# Patient Record
Sex: Female | Born: 1980 | Race: White | Hispanic: No | Marital: Married | Smoking: Former smoker
Health system: Southern US, Community
[De-identification: ages and names within clinical notes are randomized; demographics above are authoritative.]

## PROBLEM LIST (undated history)

## (undated) DIAGNOSIS — N809 Endometriosis, unspecified: Secondary | ICD-10-CM

## (undated) DIAGNOSIS — I8290 Acute embolism and thrombosis of unspecified vein: Secondary | ICD-10-CM

## (undated) DIAGNOSIS — R87619 Unspecified abnormal cytological findings in specimens from cervix uteri: Secondary | ICD-10-CM

## (undated) DIAGNOSIS — N83209 Unspecified ovarian cyst, unspecified side: Secondary | ICD-10-CM

## (undated) DIAGNOSIS — N979 Female infertility, unspecified: Secondary | ICD-10-CM

## (undated) HISTORY — DX: Unspecified ovarian cyst, unspecified side: N83.209

## (undated) HISTORY — DX: Female infertility, unspecified: N97.9

## (undated) HISTORY — PX: ABLATION: SHX5711

## (undated) HISTORY — DX: Endometriosis, unspecified: N80.9

## (undated) HISTORY — DX: Acute embolism and thrombosis of unspecified vein: I82.90

## (undated) HISTORY — DX: Unspecified abnormal cytological findings in specimens from cervix uteri: R87.619

## (undated) HISTORY — PX: COLPOSCOPY: SHX161

---

## 2018-11-07 DIAGNOSIS — I83811 Varicose veins of right lower extremities with pain: Secondary | ICD-10-CM | POA: Diagnosis not present

## 2018-11-07 DIAGNOSIS — I8311 Varicose veins of right lower extremity with inflammation: Secondary | ICD-10-CM | POA: Diagnosis not present

## 2018-12-19 DIAGNOSIS — I83811 Varicose veins of right lower extremities with pain: Secondary | ICD-10-CM | POA: Diagnosis not present

## 2018-12-19 DIAGNOSIS — I8311 Varicose veins of right lower extremity with inflammation: Secondary | ICD-10-CM | POA: Diagnosis not present

## 2019-05-14 ENCOUNTER — Encounter: Payer: Self-pay | Admitting: Certified Nurse Midwife

## 2019-07-04 ENCOUNTER — Ambulatory Visit (INDEPENDENT_AMBULATORY_CARE_PROVIDER_SITE_OTHER): Payer: BC Managed Care – PPO | Admitting: Certified Nurse Midwife

## 2019-07-04 ENCOUNTER — Other Ambulatory Visit: Payer: Self-pay

## 2019-07-04 ENCOUNTER — Other Ambulatory Visit (HOSPITAL_COMMUNITY)
Admission: RE | Admit: 2019-07-04 | Discharge: 2019-07-04 | Disposition: A | Discharge status: Home / Self Care | Payer: BC Managed Care – PPO | Source: Ambulatory Visit | Attending: Certified Nurse Midwife | Admitting: Certified Nurse Midwife

## 2019-07-04 ENCOUNTER — Encounter: Payer: Self-pay | Admitting: Certified Nurse Midwife

## 2019-07-04 VITALS — BP 110/74 | HR 70 | Temp 98.1°F | Ht 65.75 in | Wt 156.0 lb

## 2019-07-04 DIAGNOSIS — Z124 Encounter for screening for malignant neoplasm of cervix: Secondary | ICD-10-CM

## 2019-07-04 DIAGNOSIS — Z01419 Encounter for gynecological examination (general) (routine) without abnormal findings: Secondary | ICD-10-CM

## 2019-07-04 DIAGNOSIS — Z30011 Encounter for initial prescription of contraceptive pills: Secondary | ICD-10-CM

## 2019-07-04 MED ORDER — NORETHINDRONE 0.35 MG PO TABS: 1.0000 | ORAL_TABLET | Freq: Every day | ORAL | 4 refills | Status: DC

## 2019-07-04 NOTE — Progress Notes (Signed)
39 y.o. G0P0000 Married  Caucasian Fe here to establish gyn care and  for annual exam. Periods are regular 3 day duration and scant to none. Contraception OCP. Labs with last MD. Denies warning signs with use. History of blood clot in right leg was seen by MD and has resolved. Denies Migraine headaches. Has appointment for Covid vaccine soon. Recent move here from [place] due spouse employment. No other medical concerns today.  Patient's last menstrual period was 05/16/2019 (exact date).          Sexually active: Yes.    The current method of family planning is oral progesterone-only contraceptive.    Exercising: Yes.    walking & yoga Smoker:  no  Review of Systems  Constitutional: Negative.   HENT: Negative.   Eyes: Negative.   Respiratory: Negative.   Cardiovascular: Negative.   Gastrointestinal: Negative.   Genitourinary: Negative.   Musculoskeletal: Negative.   Skin: Negative.   Neurological: Negative.   Endo/Heme/Allergies: Negative.   Psychiatric/Behavioral: Negative.     Health Maintenance: Pap:  2020? History of Abnormal Pap: yes MMG:  none Self Breast exams: yes Colonoscopy:  none BMD:   none TDaP:  2016 Shingles: no Pneumonia: no Hep C and HIV: not done Labs: if needed.   reports that she has quit smoking. She has never used smokeless tobacco. She reports previous alcohol use. She reports that she does not use drugs.  Past Medical History:  Diagnosis Date  . Abnormal Pap smear of cervix   . Blood clot in vein    leg  . Endometriosis   . Infertility, female   . Ovarian cyst     Past Surgical History:  Procedure Laterality Date  . ABLATION    . COLPOSCOPY      Current Outpatient Medications  Medication Sig Dispense Refill  . Levonorgestrel-Ethinyl Estradiol (AMETHIA) 0.15-0.03 &0.01 MG tablet TAKE 1 TABLET(S) EVERY DAY BY ORAL ROUTE.     No current facility-administered medications for this visit.    Family History  Problem Relation Age of Onset   . Cancer Maternal Grandfather     ROS:  Pertinent items are noted in HPI.  Otherwise, a comprehensive ROS was negative.  Exam:   Temp 98.1 F (36.7 C) (Skin)   Ht 5' 5.75" (1.67 m)   Wt 156 lb (70.8 kg)   LMP 05/16/2019 (Exact Date)   BMI 25.37 kg/m  Height: 5' 5.75" (167 cm) Ht Readings from Last 3 Encounters:  07/04/19 5' 5.75" (1.67 m)    General appearance: alert, cooperative and appears stated age Head: Normocephalic, without obvious abnormality, atraumatic Neck: no adenopathy, supple, symmetrical, trachea midline and thyroid normal to inspection and palpation Lungs: clear to auscultation bilaterally Breasts: normal appearance, no masses or tenderness, No nipple retraction or dimpling, No nipple discharge or bleeding, No axillary or supraclavicular adenopathy, Taught monthly breast self examination Heart: regular rate and rhythm Abdomen: soft, non-tender; no masses,  no organomegaly Extremities: extremities normal, atraumatic, no cyanosis or edema Skin: Skin color, texture, turgor normal. No rashes or lesions Lymph nodes: Cervical, supraclavicular, and axillary nodes normal. No abnormal inguinal nodes palpated Neurologic: Grossly normal   Pelvic: External genitalia:  no lesions, normal female              Urethra:  normal appearing urethra with no masses, tenderness or lesions              Bartholin's and Skene's: normal  Vagina: normal appearing vagina with normal color and discharge, no lesions              Cervix: no cervical motion tenderness, no lesions and nulliparous appearance              Pap taken: Yes.   Bimanual Exam:  Uterus:  normal size, contour, position, consistency, mobility, non-tender and anteverted              Adnexa: normal adnexa and no mass, fullness, tenderness               Rectovaginal: Confirms               Anus:  normal sphincter tone, no lesions  Chaperone present: yes  A:  Well Woman with normal exam  Contraception  OCP desired for cycle control, history of infertility  History of blood clot in right leg, no sequelae   History of ablation due to menorrhagia  History of abnormal pap smear no treatment  P:   Reviewed health and wellness pertinent to exam  Discussed estrogen use after blood clot in leg and contraindicated. Discussed progesterone only pill use recommended. Patient voiced understanding and would like to continue OCP for cycle control.  Rx Micronor see order with instructions  Warning signs with use reviewed and need to advise if occurs.  Pap smear: yes  counseled on breast self exam, mammography screening and information to schedule 6 weeks after last Covid vaccine, feminine hygiene, use and side effects of OCP's, adequate intake of calcium and vitamin D, diet and exercise  return annually or prn  An After Visit Summary was printed and given to the patient.

## 2019-07-04 NOTE — Patient Instructions (Signed)

## 2019-07-09 LAB — CYTOLOGY - PAP
Comment: NEGATIVE
Diagnosis: NEGATIVE
Diagnosis: REACTIVE
High risk HPV: NEGATIVE

## 2020-05-19 DIAGNOSIS — Z03818 Encounter for observation for suspected exposure to other biological agents ruled out: Secondary | ICD-10-CM | POA: Diagnosis not present

## 2020-05-19 DIAGNOSIS — Z20822 Contact with and (suspected) exposure to covid-19: Secondary | ICD-10-CM | POA: Diagnosis not present

## 2020-07-09 ENCOUNTER — Encounter: Payer: Self-pay | Admitting: Obstetrics & Gynecology

## 2020-07-09 ENCOUNTER — Ambulatory Visit (INDEPENDENT_AMBULATORY_CARE_PROVIDER_SITE_OTHER): Payer: BC Managed Care – PPO | Admitting: Obstetrics & Gynecology

## 2020-07-09 ENCOUNTER — Other Ambulatory Visit: Payer: Self-pay

## 2020-07-09 VITALS — BP 116/70 | Ht 66.0 in | Wt 139.0 lb

## 2020-07-09 DIAGNOSIS — Z3041 Encounter for surveillance of contraceptive pills: Secondary | ICD-10-CM | POA: Diagnosis not present

## 2020-07-09 DIAGNOSIS — Z01419 Encounter for gynecological examination (general) (routine) without abnormal findings: Secondary | ICD-10-CM | POA: Diagnosis not present

## 2020-07-09 NOTE — Progress Notes (Signed)
    Marlen Koman 05-18-1980 [id number]   History:    40 y.o. G0 Stable partner  RP:  Established patient presenting for annual gyn exam   HPI:  Periods are regular 3 day duration and scant to none on the Progestin-only pill.  No BTB.  No pelvic pain.  No pain with IC. H/O Endometriosis Dxed by LPS.  Urine/BMs normal.  Breasts normal.  No screening mammo yet.  History of superficial venous thrombosis was seen by Vein Specialist 2 yrs ago.  Walking/yoga and healthy nutrition.  BMI 22.44.   Past medical history,surgical history, family history and social history were all reviewed and documented in the EPIC chart.  Gynecologic History No LMP recorded. (Menstrual status: Oral contraceptives).  Obstetric History OB History  Gravida Para Term Preterm AB Living  0 0 0 0 0 0  SAB IAB Ectopic Multiple Live Births  0 0 0 0 0  Obstetric Comments  1 adopted child     ROS: A ROS was performed and pertinent positives and negatives are included in the history.  GENERAL: No fevers or chills. HEENT: No change in vision, no earache, sore throat or sinus congestion. NECK: No pain or stiffness. CARDIOVASCULAR: No chest pain or pressure. No palpitations. PULMONARY: No shortness of breath, cough or wheeze. GASTROINTESTINAL: No abdominal pain, nausea, vomiting or diarrhea, melena or bright red blood per rectum. GENITOURINARY: No urinary frequency, urgency, hesitancy or dysuria. MUSCULOSKELETAL: No joint or muscle pain, no back pain, no recent trauma. DERMATOLOGIC: No rash, no itching, no lesions. ENDOCRINE: No polyuria, polydipsia, no heat or cold intolerance. No recent change in weight. HEMATOLOGICAL: No anemia or easy bruising or bleeding. NEUROLOGIC: No headache, seizures, numbness, tingling or weakness. PSYCHIATRIC: No depression, no loss of interest in normal activity or change in sleep pattern.     Exam:   BP 116/70   Ht 5\' 6"  (1.676 m)   Wt 139 lb (63 kg)   BMI 22.44 kg/m   Body mass  index is 22.44 kg/m.  General appearance : Well developed well nourished female. No acute distress HEENT: Eyes: no retinal hemorrhage or exudates,  Neck supple, trachea midline, no carotid bruits, no thyroidmegaly Lungs: Clear to auscultation, no rhonchi or wheezes, or rib retractions  Heart: Regular rate and rhythm, no murmurs or gallops Breast:Examined in sitting and supine position were symmetrical in appearance, no palpable masses or tenderness,  no skin retraction, no nipple inversion, no nipple discharge, no skin discoloration, no axillary or supraclavicular lymphadenopathy Abdomen: no palpable masses or tenderness, no rebound or guarding Extremities: no edema or skin discoloration or tenderness  Pelvic: Vulva: Normal             Vagina: No gross lesions or discharge  Cervix: No gross lesions or discharge.  Pap reflex done.  Uterus  AV, normal size, shape and consistency, non-tender and mobile  Adnexa  Without masses or tenderness  Anus: Normal   Assessment/Plan:  40 y.o. female for annual exam   1. Encounter for routine gynecological examination with Papanicolaou smear of cervix Normal gynecologic exam.  Pap reflex done.  Breast exam normal.  Schedule screening mammogram now.  Good body mass index at 22.44.  Continue with fitness and healthy nutrition.  2. Encounter for surveillance of contraceptive pills Well on the progestin only pill.  No contraindication to continue.  Represcription sent to pharmacy.  46 MD, 9:27 AM 07/09/2020

## 2020-07-13 LAB — PAP IG W/ RFLX HPV ASCU

## 2020-07-14 ENCOUNTER — Other Ambulatory Visit: Payer: Self-pay

## 2020-07-14 DIAGNOSIS — Z30011 Encounter for initial prescription of contraceptive pills: Secondary | ICD-10-CM

## 2020-07-14 MED ORDER — NORETHINDRONE 0.35 MG PO TABS: 1.0000 | ORAL_TABLET | Freq: Every day | ORAL | 4 refills | Status: DC

## 2020-07-14 NOTE — Telephone Encounter (Signed)
AEX was 07/09/20.

## 2020-07-15 ENCOUNTER — Encounter: Payer: Self-pay | Admitting: Obstetrics & Gynecology

## 2020-07-16 ENCOUNTER — Other Ambulatory Visit: Payer: Self-pay | Admitting: Obstetrics & Gynecology

## 2020-07-16 DIAGNOSIS — Z1231 Encounter for screening mammogram for malignant neoplasm of breast: Secondary | ICD-10-CM

## 2020-09-07 ENCOUNTER — Ambulatory Visit
Admission: RE | Admit: 2020-09-07 | Discharge: 2020-09-07 | Disposition: A | Discharge status: Home / Self Care | Payer: BC Managed Care – PPO | Source: Ambulatory Visit | Attending: Obstetrics & Gynecology | Admitting: Obstetrics & Gynecology

## 2020-09-07 ENCOUNTER — Other Ambulatory Visit: Payer: Self-pay

## 2020-09-07 DIAGNOSIS — Z1231 Encounter for screening mammogram for malignant neoplasm of breast: Secondary | ICD-10-CM | POA: Diagnosis not present

## 2021-07-14 ENCOUNTER — Encounter: Payer: Self-pay | Admitting: Obstetrics & Gynecology

## 2021-07-14 ENCOUNTER — Ambulatory Visit (INDEPENDENT_AMBULATORY_CARE_PROVIDER_SITE_OTHER): Payer: BC Managed Care – PPO | Admitting: Obstetrics & Gynecology

## 2021-07-14 VITALS — BP 122/80 | Resp 14 | Ht 66.0 in | Wt 140.0 lb

## 2021-07-14 DIAGNOSIS — Z3041 Encounter for surveillance of contraceptive pills: Secondary | ICD-10-CM | POA: Diagnosis not present

## 2021-07-14 DIAGNOSIS — E559 Vitamin D deficiency, unspecified: Secondary | ICD-10-CM | POA: Diagnosis not present

## 2021-07-14 DIAGNOSIS — Z01419 Encounter for gynecological examination (general) (routine) without abnormal findings: Secondary | ICD-10-CM | POA: Diagnosis not present

## 2021-07-14 MED ORDER — NORETHINDRONE 0.35 MG PO TABS: 1.0000 | ORAL_TABLET | Freq: Every day | ORAL | 4 refills | Status: DC

## 2021-07-14 NOTE — Progress Notes (Signed)
? ? ?Alexandra Ballard Jun 09, 1980 [id number] ? ? ?History:    41 y.o.  G0 Stable partner ?  ?RP:  Established patient presenting for annual gyn exam  ?  ?HPI:  Periods are regular 3 day duration and scant to none on the Progestin-only pill.  No BTB.  No pelvic pain.  No pain with IC. Pap Neg 06/2020.  No H/O Abnormal Pap since her 20's when a Colpo was done, but no need for treatment.  H/O Endometriosis Dxed by LPS.  Urine/BMs normal.  Breasts normal.  Mammo Neg 08/2020.  History of superficial venous thrombosis was seen by Vein Specialist 3 yrs ago.  Walking/yoga and healthy nutrition.  BMI 22.6.  Fasting health labs here today. ? ? ?Past medical history,surgical history, family history and social history were all reviewed and documented in the EPIC chart. ? ?Gynecologic History ?No LMP recorded (lmp unknown). (Menstrual status: Oral contraceptives). ? ?Obstetric History ?OB History  ?Gravida Para Term Preterm AB Living  ?0 0 0 0 0 0  ?SAB IAB Ectopic Multiple Live Births  ?0 0 0 0 0  ?Obstetric Comments  ?1 adopted child  ? ? ? ?ROS: A ROS was performed and pertinent positives and negatives are included in the history. ? GENERAL: No fevers or chills. HEENT: No change in vision, no earache, sore throat or sinus congestion. NECK: No pain or stiffness. CARDIOVASCULAR: No chest pain or pressure. No palpitations. PULMONARY: No shortness of breath, cough or wheeze. GASTROINTESTINAL: No abdominal pain, nausea, vomiting or diarrhea, melena or bright red blood per rectum. GENITOURINARY: No urinary frequency, urgency, hesitancy or dysuria. MUSCULOSKELETAL: No joint or muscle pain, no back pain, no recent trauma. DERMATOLOGIC: No rash, no itching, no lesions. ENDOCRINE: No polyuria, polydipsia, no heat or cold intolerance. No recent change in weight. HEMATOLOGICAL: No anemia or easy bruising or bleeding. NEUROLOGIC: No headache, seizures, numbness, tingling or weakness. PSYCHIATRIC: No depression, no loss of interest in normal  activity or change in sleep pattern.  ?  ? ?Exam: ? ? ?BP 122/80 (BP Location: Right Arm, Patient Position: Sitting, Cuff Size: Normal)   Resp 14   Ht _0  (1.676 m)   Wt 140 lb (63.5 kg)   LMP  (LMP Unknown) Comment: spotting occasionally  BMI 22.60 kg/m?  ? ?Body mass index is 22.6 kg/m?. ? ?General appearance : Well developed well nourished female. No acute distress ?HEENT: Eyes: no retinal hemorrhage or exudates,  Neck supple, trachea midline, no carotid bruits, no thyroidmegaly ?Lungs: Clear to auscultation, no rhonchi or wheezes, or rib retractions  ?Heart: Regular rate and rhythm, no murmurs or gallops ?Breast:Examined in sitting and supine position were symmetrical in appearance, no palpable masses or tenderness,  no skin retraction, no nipple inversion, no nipple discharge, no skin discoloration, no axillary or supraclavicular lymphadenopathy ?Abdomen: no palpable masses or tenderness, no rebound or guarding ?Extremities: no edema or skin discoloration or tenderness ? ?Pelvic: Vulva: Normal ?            Vagina: No gross lesions or discharge ? Cervix: No gross lesions or discharge ? Uterus  AV, normal size, shape and consistency, non-tender and mobile ? Adnexa  Without masses or tenderness ? Anus: Normal ? ? ?Assessment/Plan:  41 y.o. female for annual exam  ? ?1. Encounter for routine gynecological examination with Papanicolaou smear of cervix ?Periods are regular 3 day duration and scant to none on the Progestin-only pill.  No BTB.  No pelvic pain.  No pain with  IC. Pap Neg 06/2020.  No H/O Abnormal Pap since her 20's when a Colpo was done, but no need for treatment.  H/O Endometriosis Dxed by LPS.  Urine/BMs normal.  Breasts normal.  Mammo Neg 08/2020.  History of superficial venous thrombosis was seen by Vein Specialist 3 yrs ago.  Walking/yoga and healthy nutrition.  BMI 22.6.  Fasting health labs here today. ?- CBC ?- Comp Met (CMET) ?- TSH ?- Lipid Profile ?- Vitamin D (25 hydroxy) ? ?2. Encounter  for surveillance of contraceptive pills ?Periods are regular 3 day duration and scant to none on the Progestin-only pill.  No BTB.  No pelvic pain.  No pain with IC.  No CI to continue on the Progestin pill, prescription sent to pharmacy. ?- norethindrone (MICRONOR) 0.35 MG tablet; Take 1 tablet (0.35 mg total) by mouth daily.  ? ?Princess Bruins MD, 8:39 AM 07/14/2021 ? ?  ?

## 2021-07-15 LAB — COMPREHENSIVE METABOLIC PANEL
AG Ratio: 1.9 (calc) (ref 1.0–2.5)
ALT: 13 U/L (ref 6–29)
AST: 15 U/L (ref 10–30)
Albumin: 4.8 g/dL (ref 3.6–5.1)
Alkaline phosphatase (APISO): 55 U/L (ref 31–125)
BUN: 14 mg/dL (ref 7–25)
CO2: 27 mmol/L (ref 20–32)
Calcium: 9.5 mg/dL (ref 8.6–10.2)
Chloride: 105 mmol/L (ref 98–110)
Creat: 0.7 mg/dL (ref 0.50–0.99)
Globulin: 2.5 g/dL (calc) (ref 1.9–3.7)
Glucose, Bld: 90 mg/dL (ref 65–99)
Potassium: 4.8 mmol/L (ref 3.5–5.3)
Sodium: 139 mmol/L (ref 135–146)
Total Bilirubin: 0.6 mg/dL (ref 0.2–1.2)
Total Protein: 7.3 g/dL (ref 6.1–8.1)

## 2021-07-15 LAB — LIPID PANEL
Cholesterol: 170 mg/dL (ref <200)
HDL: 60 mg/dL (ref >=50)
LDL Cholesterol (Calc): 99 mg/dL (calc)
Non-HDL Cholesterol (Calc): 110 mg/dL (calc) (ref <130)
Total CHOL/HDL Ratio: 2.8 (calc) (ref <5.0)
Triglycerides: 39 mg/dL (ref <150)

## 2021-07-15 LAB — CBC
HCT: 41.7 % (ref 35.0–45.0)
Hemoglobin: 14.1 g/dL (ref 11.7–15.5)
MCH: 31.8 pg (ref 27.0–33.0)
MCHC: 33.8 g/dL (ref 32.0–36.0)
MCV: 93.9 fL (ref 80.0–100.0)
MPV: 10.9 fL (ref 7.5–12.5)
Platelets: 231 10*3/uL (ref 140–400)
RBC: 4.44 10*6/uL (ref 3.80–5.10)
RDW: 12 % (ref 11.0–15.0)
WBC: 5.6 10*3/uL (ref 3.8–10.8)

## 2021-07-15 LAB — VITAMIN D 25 HYDROXY (VIT D DEFICIENCY, FRACTURES): Vit D, 25-Hydroxy: 18 ng/mL — ABNORMAL LOW (ref 30–100)

## 2021-07-15 LAB — TSH: TSH: 2.11 mIU/L

## 2021-07-25 ENCOUNTER — Other Ambulatory Visit: Payer: Self-pay | Admitting: *Deleted

## 2021-07-25 DIAGNOSIS — E559 Vitamin D deficiency, unspecified: Secondary | ICD-10-CM

## 2021-07-25 MED ORDER — VITAMIN D (ERGOCALCIFEROL) 1.25 MG (50000 UNIT) PO CAPS: 50000.0000 [IU] | ORAL_CAPSULE | ORAL | 0 refills | Status: DC

## 2021-08-02 ENCOUNTER — Encounter: Payer: Self-pay | Admitting: Obstetrics & Gynecology

## 2021-08-26 ENCOUNTER — Other Ambulatory Visit: Payer: Self-pay | Admitting: Obstetrics & Gynecology

## 2021-08-26 DIAGNOSIS — Z1231 Encounter for screening mammogram for malignant neoplasm of breast: Secondary | ICD-10-CM

## 2021-09-08 ENCOUNTER — Ambulatory Visit
Admission: RE | Admit: 2021-09-08 | Discharge: 2021-09-08 | Disposition: A | Discharge status: Home / Self Care | Payer: Self-pay | Source: Ambulatory Visit | Attending: Obstetrics & Gynecology | Admitting: Obstetrics & Gynecology

## 2021-09-08 DIAGNOSIS — Z1231 Encounter for screening mammogram for malignant neoplasm of breast: Secondary | ICD-10-CM

## 2021-10-18 ENCOUNTER — Other Ambulatory Visit: Payer: Self-pay | Admitting: Obstetrics & Gynecology

## 2021-10-24 ENCOUNTER — Other Ambulatory Visit: Payer: BC Managed Care – PPO

## 2021-10-24 DIAGNOSIS — E559 Vitamin D deficiency, unspecified: Secondary | ICD-10-CM

## 2021-10-25 LAB — VITAMIN D 25 HYDROXY (VIT D DEFICIENCY, FRACTURES): Vit D, 25-Hydroxy: 68 ng/mL (ref 30–100)

## 2022-08-03 ENCOUNTER — Other Ambulatory Visit: Payer: Self-pay | Admitting: Obstetrics & Gynecology

## 2022-08-03 DIAGNOSIS — Z1231 Encounter for screening mammogram for malignant neoplasm of breast: Secondary | ICD-10-CM

## 2022-08-31 ENCOUNTER — Encounter: Payer: Self-pay | Admitting: Obstetrics & Gynecology

## 2022-08-31 ENCOUNTER — Other Ambulatory Visit (HOSPITAL_COMMUNITY)
Admission: RE | Admit: 2022-08-31 | Discharge: 2022-08-31 | Disposition: A | Discharge status: Home / Self Care | Payer: BC Managed Care – PPO | Source: Ambulatory Visit | Attending: Obstetrics & Gynecology | Admitting: Obstetrics & Gynecology

## 2022-08-31 ENCOUNTER — Ambulatory Visit (INDEPENDENT_AMBULATORY_CARE_PROVIDER_SITE_OTHER): Payer: BC Managed Care – PPO | Admitting: Obstetrics & Gynecology

## 2022-08-31 VITALS — BP 112/74 | HR 78 | Resp 18 | Ht 65.35 in | Wt 137.4 lb

## 2022-08-31 DIAGNOSIS — Z3041 Encounter for surveillance of contraceptive pills: Secondary | ICD-10-CM

## 2022-08-31 DIAGNOSIS — Z01419 Encounter for gynecological examination (general) (routine) without abnormal findings: Secondary | ICD-10-CM

## 2022-08-31 DIAGNOSIS — E559 Vitamin D deficiency, unspecified: Secondary | ICD-10-CM | POA: Diagnosis not present

## 2022-08-31 DIAGNOSIS — Z0000 Encounter for general adult medical examination without abnormal findings: Secondary | ICD-10-CM | POA: Diagnosis not present

## 2022-08-31 MED ORDER — NORETHINDRONE 0.35 MG PO TABS: 1.0000 | ORAL_TABLET | Freq: Every day | ORAL | 4 refills | Status: DC

## 2022-08-31 NOTE — Progress Notes (Signed)
Alexandra Ballard 11-Feb-1981 [id number]   History:    42 y.o. G0 Stable partner   RP:  Established patient presenting for annual gyn exam    HPI:  Well on the Progestin-only pill.  No BTB.  No pelvic pain.  No pain with IC. Pap Neg 06/2020. No H/O Abnormal Pap since her 20's when a Colpo was done, but no need for treatment.  Pap reflex today. H/O Endometriosis Dxed by LPS. Urine/BMs normal.  Breasts normal. Mammo Neg 08/2021, scheduled for 09/2022.  History of superficial venous thrombosis was seen by Vein Specialist 4 yrs ago.  Walking/yoga and healthy nutrition. BMI 22.62.  Fasting health labs here today.    Past medical history,surgical history, family history and social history were all reviewed and documented in the EPIC chart.  Gynecologic History No LMP recorded. (Menstrual status: Oral contraceptives).  Obstetric History OB History  Gravida Para Term Preterm AB Living  0 0 0 0 0 0  SAB IAB Ectopic Multiple Live Births  0 0 0 0 0  Obstetric Comments  1 adopted child     ROS: A ROS was performed and pertinent positives and negatives are included in the history. GENERAL: No fevers or chills. HEENT: No change in vision, no earache, sore throat or sinus congestion. NECK: No pain or stiffness. CARDIOVASCULAR: No chest pain or pressure. No palpitations. PULMONARY: No shortness of breath, cough or wheeze. GASTROINTESTINAL: No abdominal pain, nausea, vomiting or diarrhea, melena or bright red blood per rectum. GENITOURINARY: No urinary frequency, urgency, hesitancy or dysuria. MUSCULOSKELETAL: No joint or muscle pain, no back pain, no recent trauma. DERMATOLOGIC: No rash, no itching, no lesions. ENDOCRINE: No polyuria, polydipsia, no heat or cold intolerance. No recent change in weight. HEMATOLOGICAL: No anemia or easy bruising or bleeding. NEUROLOGIC: No headache, seizures, numbness, tingling or weakness. PSYCHIATRIC: No depression, no loss of interest in normal activity or change in sleep  pattern.     Exam:   BP 112/74 (BP Location: Right Arm, Patient Position: Sitting)   Pulse 78   Resp 18   Ht 5' 5.35" (1.66 m)   Wt 137 lb 6.4 oz (62.3 kg)   BMI 22.62 kg/m   Body mass index is 22.62 kg/m.  General appearance : Well developed well nourished female. No acute distress HEENT: Eyes: no retinal hemorrhage or exudates,  Neck supple, trachea midline, no carotid bruits, no thyroidmegaly Lungs: Clear to auscultation, no rhonchi or wheezes, or rib retractions  Heart: Regular rate and rhythm, no murmurs or gallops Breast:Examined in sitting and supine position were symmetrical in appearance, no palpable masses or tenderness,  no skin retraction, no nipple inversion, no nipple discharge, no skin discoloration, no axillary or supraclavicular lymphadenopathy Abdomen: no palpable masses or tenderness, no rebound or guarding Extremities: no edema or skin discoloration or tenderness  Pelvic: Vulva: Normal             Vagina: No gross lesions or discharge  Cervix: No gross lesions or discharge  Uterus  AV, normal size, shape and consistency, non-tender and mobile  Adnexa  Without masses or tenderness  Anus: Normal   Assessment/Plan:  42 y.o. female for annual exam   1. Encounter for routine gynecological examination with Papanicolaou smear of cervix Well on the Progestin-only pill.  No BTB.  No pelvic pain.  No pain with IC. Pap Neg 06/2020. No H/O Abnormal Pap since her 20's when a Colpo was done, but no need for treatment.  Pap reflex today.  H/O Endometriosis Dxed by LPS. Urine/BMs normal.  Breasts normal. Mammo Neg 08/2021, scheduled for 09/2022.  History of superficial venous thrombosis was seen by Vein Specialist 4 yrs ago.  Walking/yoga and healthy nutrition. BMI 22.62.  Fasting health labs here today. - Cytology - PAP( [place]) - CBC - Comp Met (CMET) - Lipid Profile - TSH - Vitamin D (25 hydroxy)  2. Encounter for surveillance of contraceptive pills Well on the  Progestin-only pill.  No BTB.  No pelvic pain.  No pain with IC. No CI to continue on the Progestin pill.  Prescription sent to pharmacy. - norethindrone (MICRONOR) 0.35 MG tablet; Take 1 tablet (0.35 mg total) by mouth daily.   Genia Del MD, 11:50 AM

## 2022-09-01 LAB — COMPREHENSIVE METABOLIC PANEL
AG Ratio: 2 (calc) (ref 1.0–2.5)
ALT: 8 U/L (ref 6–29)
AST: 13 U/L (ref 10–30)
Albumin: 4.9 g/dL (ref 3.6–5.1)
Alkaline phosphatase (APISO): 54 U/L (ref 31–125)
BUN: 9 mg/dL (ref 7–25)
CO2: 22 mmol/L (ref 20–32)
Calcium: 9.3 mg/dL (ref 8.6–10.2)
Chloride: 103 mmol/L (ref 98–110)
Creat: 0.7 mg/dL (ref 0.50–0.99)
Globulin: 2.5 g/dL (calc) (ref 1.9–3.7)
Glucose, Bld: 87 mg/dL (ref 65–99)
Potassium: 3.9 mmol/L (ref 3.5–5.3)
Sodium: 137 mmol/L (ref 135–146)
Total Bilirubin: 0.7 mg/dL (ref 0.2–1.2)
Total Protein: 7.4 g/dL (ref 6.1–8.1)

## 2022-09-01 LAB — CBC
HCT: 40.2 % (ref 35.0–45.0)
Hemoglobin: 13.6 g/dL (ref 11.7–15.5)
MCH: 31.3 pg (ref 27.0–33.0)
MCHC: 33.8 g/dL (ref 32.0–36.0)
MCV: 92.4 fL (ref 80.0–100.0)
MPV: 11.5 fL (ref 7.5–12.5)
Platelets: 200 10*3/uL (ref 140–400)
RBC: 4.35 10*6/uL (ref 3.80–5.10)
RDW: 12.1 % (ref 11.0–15.0)
WBC: 4.6 10*3/uL (ref 3.8–10.8)

## 2022-09-01 LAB — LIPID PANEL
Cholesterol: 169 mg/dL (ref <200)
HDL: 59 mg/dL (ref >=50)
LDL Cholesterol (Calc): 99 mg/dL (calc)
Non-HDL Cholesterol (Calc): 110 mg/dL (calc) (ref <130)
Total CHOL/HDL Ratio: 2.9 (calc) (ref <5.0)
Triglycerides: 39 mg/dL (ref <150)

## 2022-09-01 LAB — VITAMIN D 25 HYDROXY (VIT D DEFICIENCY, FRACTURES): Vit D, 25-Hydroxy: 38 ng/mL (ref 30–100)

## 2022-09-01 LAB — TSH: TSH: 2.13 mIU/L

## 2022-09-06 LAB — CYTOLOGY - PAP: Diagnosis: NEGATIVE

## 2022-09-12 ENCOUNTER — Ambulatory Visit
Admission: RE | Admit: 2022-09-12 | Discharge: 2022-09-12 | Disposition: A | Discharge status: Home / Self Care | Payer: BC Managed Care – PPO | Source: Ambulatory Visit | Attending: Obstetrics & Gynecology | Admitting: Obstetrics & Gynecology

## 2022-09-12 DIAGNOSIS — Z1231 Encounter for screening mammogram for malignant neoplasm of breast: Secondary | ICD-10-CM

## 2022-09-20 DIAGNOSIS — M79662 Pain in left lower leg: Secondary | ICD-10-CM | POA: Diagnosis not present

## 2022-09-20 DIAGNOSIS — M7989 Other specified soft tissue disorders: Secondary | ICD-10-CM | POA: Diagnosis not present

## 2022-09-20 DIAGNOSIS — M79661 Pain in right lower leg: Secondary | ICD-10-CM | POA: Diagnosis not present

## 2022-09-20 DIAGNOSIS — I87393 Chronic venous hypertension (idiopathic) with other complications of bilateral lower extremity: Secondary | ICD-10-CM | POA: Diagnosis not present

## 2022-09-20 DIAGNOSIS — I83893 Varicose veins of bilateral lower extremities with other complications: Secondary | ICD-10-CM | POA: Diagnosis not present

## 2022-09-20 DIAGNOSIS — M79604 Pain in right leg: Secondary | ICD-10-CM | POA: Diagnosis not present

## 2022-11-01 DIAGNOSIS — I83891 Varicose veins of right lower extremities with other complications: Secondary | ICD-10-CM | POA: Diagnosis not present

## 2022-11-08 DIAGNOSIS — Z09 Encounter for follow-up examination after completed treatment for conditions other than malignant neoplasm: Secondary | ICD-10-CM | POA: Diagnosis not present

## 2022-11-08 DIAGNOSIS — I83892 Varicose veins of left lower extremities with other complications: Secondary | ICD-10-CM | POA: Diagnosis not present

## 2022-11-08 DIAGNOSIS — I83891 Varicose veins of right lower extremities with other complications: Secondary | ICD-10-CM | POA: Diagnosis not present

## 2022-11-15 DIAGNOSIS — Z09 Encounter for follow-up examination after completed treatment for conditions other than malignant neoplasm: Secondary | ICD-10-CM | POA: Diagnosis not present

## 2022-11-15 DIAGNOSIS — I83892 Varicose veins of left lower extremities with other complications: Secondary | ICD-10-CM | POA: Diagnosis not present

## 2022-11-29 DIAGNOSIS — I83891 Varicose veins of right lower extremities with other complications: Secondary | ICD-10-CM | POA: Diagnosis not present

## 2022-12-13 DIAGNOSIS — I83892 Varicose veins of left lower extremities with other complications: Secondary | ICD-10-CM | POA: Diagnosis not present

## 2022-12-20 DIAGNOSIS — I83892 Varicose veins of left lower extremities with other complications: Secondary | ICD-10-CM | POA: Diagnosis not present

## 2022-12-20 DIAGNOSIS — Z09 Encounter for follow-up examination after completed treatment for conditions other than malignant neoplasm: Secondary | ICD-10-CM | POA: Diagnosis not present

## 2022-12-27 DIAGNOSIS — I83891 Varicose veins of right lower extremities with other complications: Secondary | ICD-10-CM | POA: Diagnosis not present

## 2023-01-10 DIAGNOSIS — I83892 Varicose veins of left lower extremities with other complications: Secondary | ICD-10-CM | POA: Diagnosis not present

## 2023-04-24 DIAGNOSIS — I872 Venous insufficiency (chronic) (peripheral): Secondary | ICD-10-CM | POA: Diagnosis not present

## 2023-04-24 DIAGNOSIS — I83893 Varicose veins of bilateral lower extremities with other complications: Secondary | ICD-10-CM | POA: Diagnosis not present

## 2023-04-28 IMAGING — MG MM DIGITAL SCREENING BILAT W/ TOMO AND CAD
6 of 10 series · 6 of 30 positions shown · non-contrast
Comparison: Previous exam(s).

CLINICAL DATA: Screening.

EXAM:
DIGITAL SCREENING BILATERAL MAMMOGRAM WITH TOMOSYNTHESIS AND CAD
TECHNIQUE: Bilateral screening digital craniocaudal and mediolateral oblique
mammograms were obtained. Bilateral screening digital breast
tomosynthesis was performed. The images were evaluated with
computer-aided detection.

[L MLO synth-2D (1 of 2)]
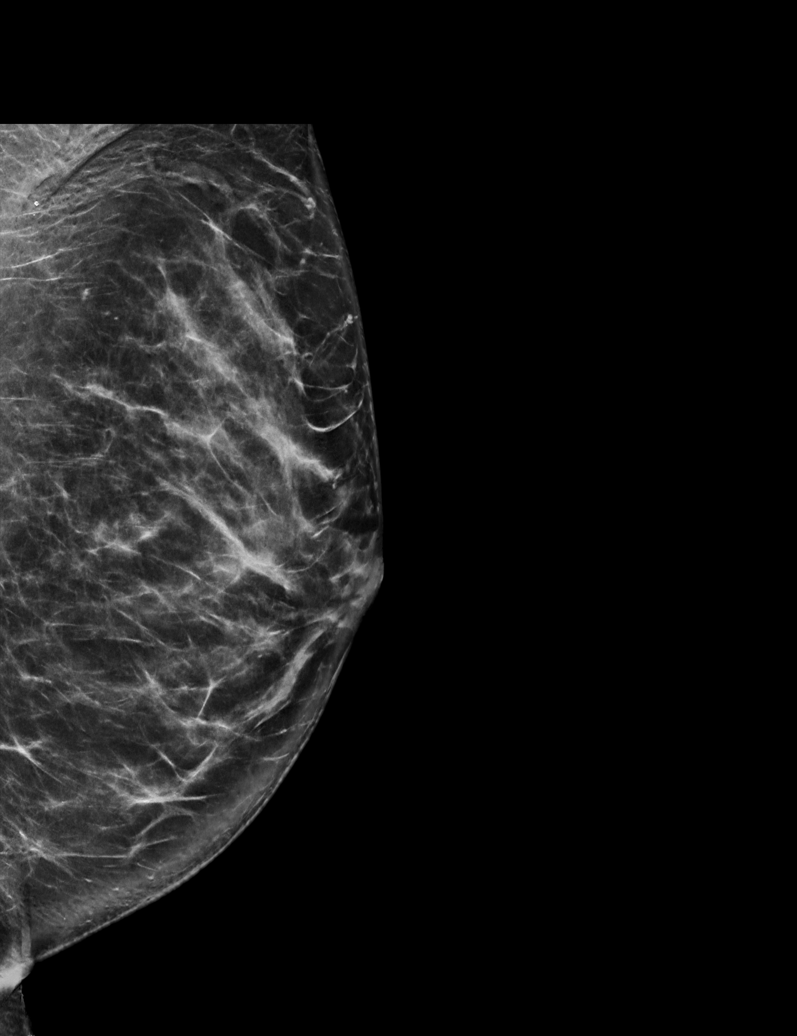

[R CC synth-2D]
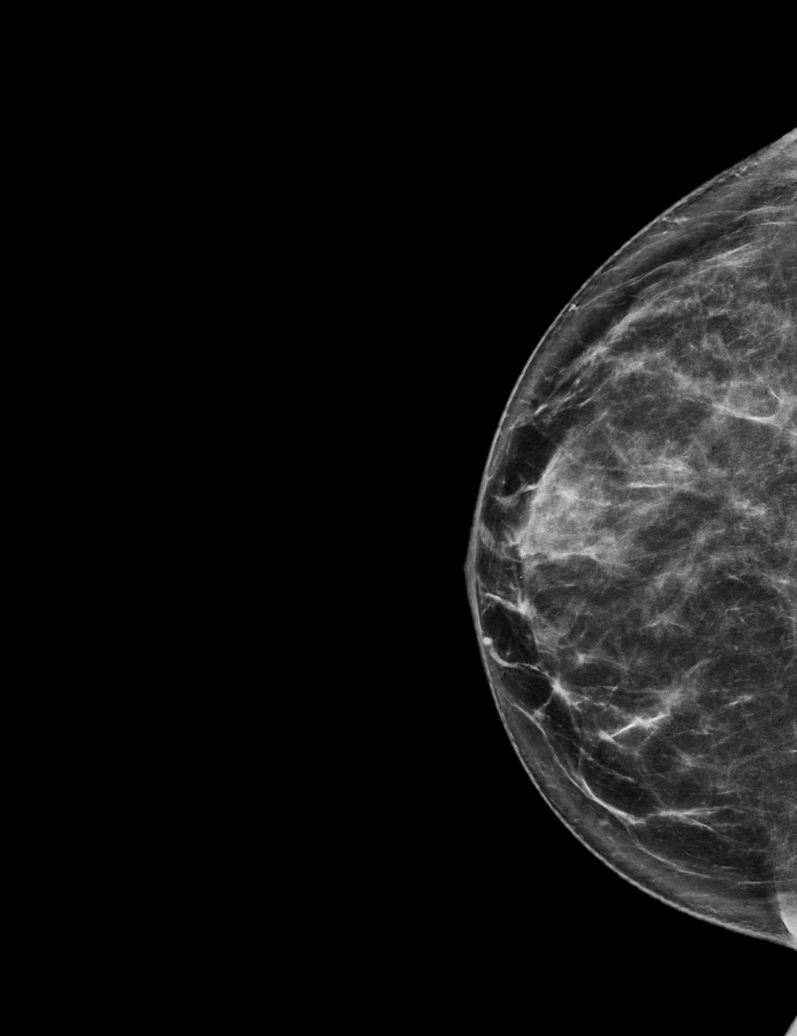

[L MLO synth-2D (2 of 2)]
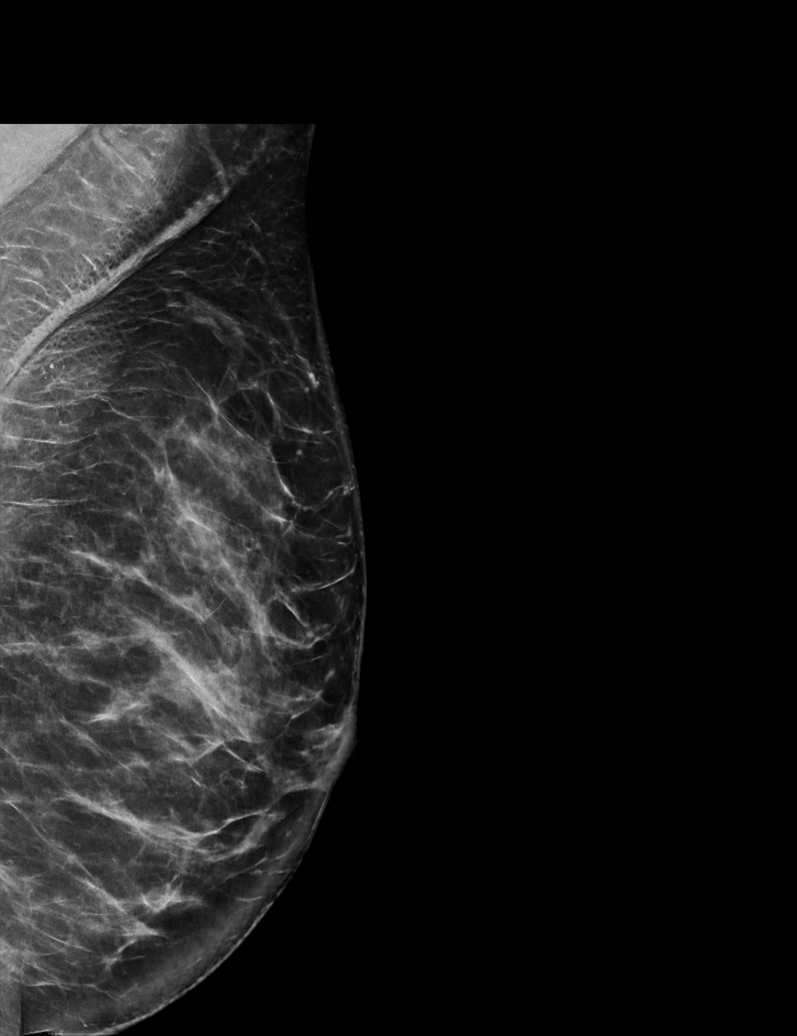

[L CC synth-2D]
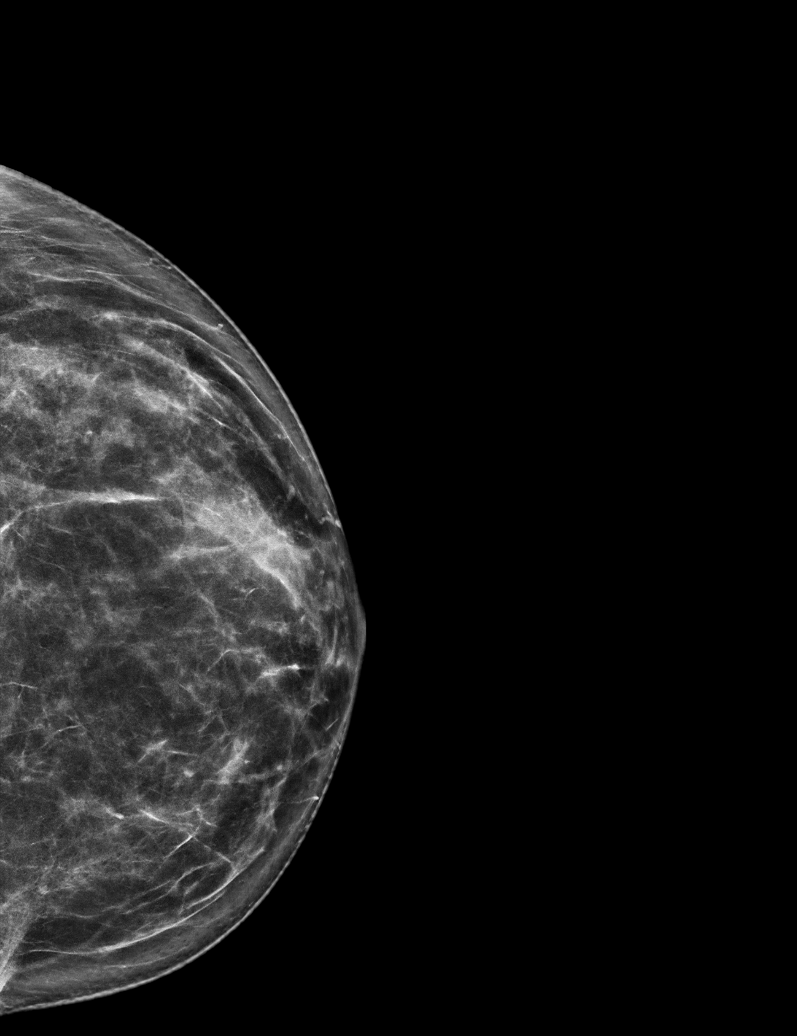

[R MLO synth-2D]
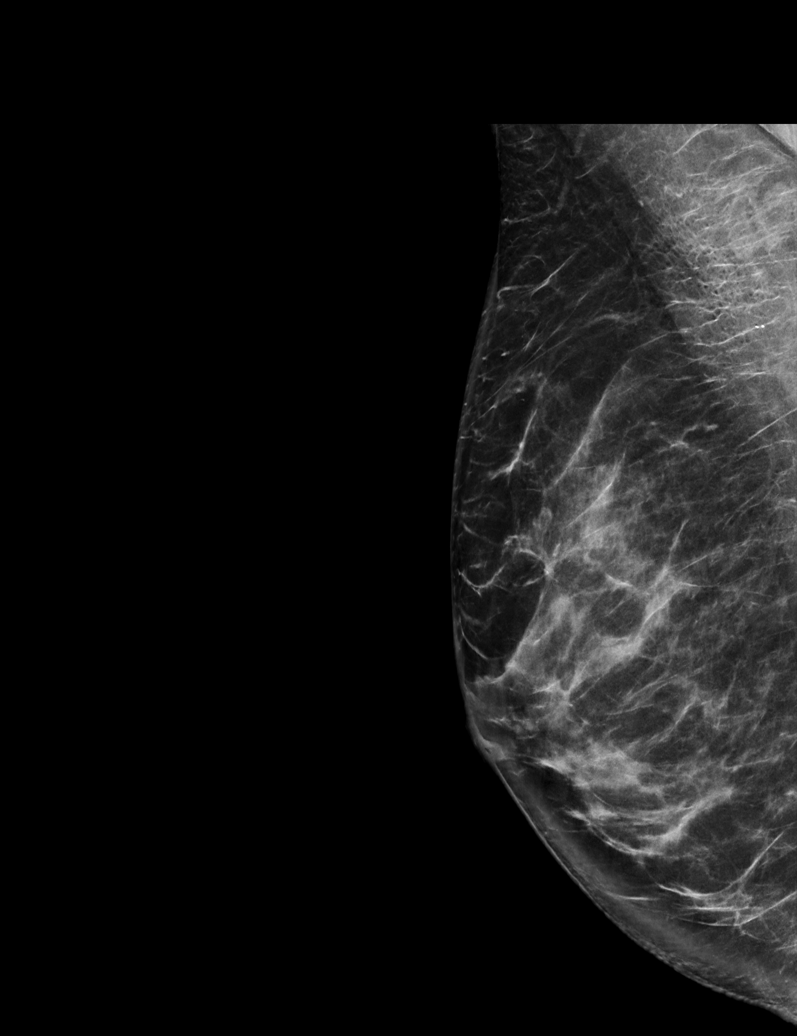

[R MLO tomo · tomo slice 41/80.0]
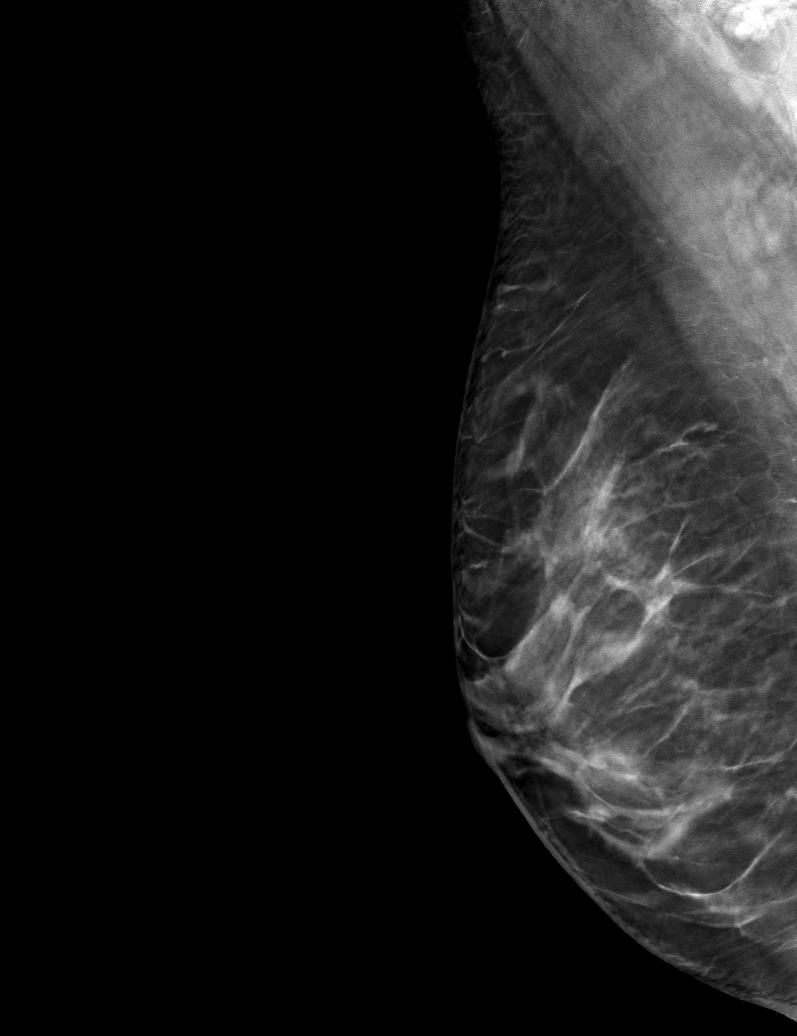

[6 of 30 positions shown; findings below may reference images not displayed]

ACR Breast Density Category c: The breast tissue is heterogeneously
dense, which may obscure small masses.
FINDINGS: There are no findings suspicious for malignancy.
IMPRESSION: No mammographic evidence of malignancy. A result letter of this
screening mammogram will be mailed directly to the patient.

RECOMMENDATION:
Screening mammogram in one year. (Code:Q3-W-BC3)

BI-RADS CATEGORY  1: Negative.

## 2023-07-30 ENCOUNTER — Other Ambulatory Visit: Payer: Self-pay | Admitting: Obstetrics and Gynecology

## 2023-07-30 DIAGNOSIS — Z0000 Encounter for general adult medical examination without abnormal findings: Secondary | ICD-10-CM

## 2023-09-02 ENCOUNTER — Other Ambulatory Visit: Payer: Self-pay | Admitting: Obstetrics & Gynecology

## 2023-09-02 DIAGNOSIS — Z3041 Encounter for surveillance of contraceptive pills: Secondary | ICD-10-CM

## 2023-09-03 NOTE — Telephone Encounter (Signed)
 Med refill request: POPs Last AEX: 08/31/2022-ML Next AEX: 09/05/2023-EB Last MMG (if hormonal med): 09/12/2022-WNL, scheduled for 09/13/2023 Refill authorized: rx pend.   Appt on 09/05/2023.

## 2023-09-05 ENCOUNTER — Ambulatory Visit (INDEPENDENT_AMBULATORY_CARE_PROVIDER_SITE_OTHER): Admitting: Obstetrics and Gynecology

## 2023-09-05 ENCOUNTER — Ambulatory Visit: Payer: Self-pay | Admitting: Obstetrics and Gynecology

## 2023-09-05 ENCOUNTER — Encounter: Payer: Self-pay | Admitting: Obstetrics and Gynecology

## 2023-09-05 VITALS — BP 110/64 | HR 78 | Ht 65.75 in | Wt 128.0 lb

## 2023-09-05 DIAGNOSIS — Z0000 Encounter for general adult medical examination without abnormal findings: Secondary | ICD-10-CM | POA: Diagnosis not present

## 2023-09-05 DIAGNOSIS — Z1331 Encounter for screening for depression: Secondary | ICD-10-CM

## 2023-09-05 DIAGNOSIS — E785 Hyperlipidemia, unspecified: Secondary | ICD-10-CM | POA: Diagnosis not present

## 2023-09-05 DIAGNOSIS — D229 Melanocytic nevi, unspecified: Secondary | ICD-10-CM | POA: Diagnosis not present

## 2023-09-05 DIAGNOSIS — Z01419 Encounter for gynecological examination (general) (routine) without abnormal findings: Secondary | ICD-10-CM

## 2023-09-05 DIAGNOSIS — Z3041 Encounter for surveillance of contraceptive pills: Secondary | ICD-10-CM

## 2023-09-05 DIAGNOSIS — E559 Vitamin D deficiency, unspecified: Secondary | ICD-10-CM | POA: Diagnosis not present

## 2023-09-05 DIAGNOSIS — Z1211 Encounter for screening for malignant neoplasm of colon: Secondary | ICD-10-CM

## 2023-09-05 MED ORDER — NORETHINDRONE 0.35 MG PO TABS: 1.0000 | ORAL_TABLET | Freq: Every day | ORAL | 6 refills | Status: AC

## 2023-09-05 NOTE — Addendum Note (Signed)
 Addended by: Reinaldo Caras on: 09/05/2023 08:44 AM   Modules accepted: Orders

## 2023-09-05 NOTE — Progress Notes (Signed)
 43 y.o. y.o. female here for annual exam. No LMP recorded. (Menstrual status: Oral contraceptives).     G0 Stable partner, Has one adopted 84 year old daughter   RP:  Established patient presenting for annual gyn exam    HPI:  Well on the Progestin-only pill.  No BTB.  No pelvic pain.  No pain with IC. Pap Neg 06/2020. No H/O Abnormal Pap since her 20's when a Colpo was done, but no need for treatment.  Pap reflex today. H/O Endometriosis Dxed by LPS. Urine/BMs normal.  Breasts normal. Mammo Neg 08/2021, scheduled for 09/2022.  History of superficial venous thrombosis was seen by Vein Specialist 4 yrs ago.  Walking/yoga and healthy nutrition. BMI 22.62 last year.  Fasting health labs here today. Colonoscopy: to begin in Jan. Referral placed Referral for derm placed for mole check Labs here   Body mass index is 20.82 kg/m.     09/05/2023    8:06 AM  Depression screen PHQ 2/9  Decreased Interest 0  Down, Depressed, Hopeless 0  PHQ - 2 Score 0    Blood pressure 110/64, pulse 78, height 5' 5.75" (1.67 m), weight 128 lb (58.1 kg), SpO2 98%.     Component Value Date/Time   DIAGPAP  08/31/2022 1204    - Negative for intraepithelial lesion or malignancy (NILM)   DIAGPAP  07/04/2019 1206    - Negative for Intraepithelial Lesions or Malignancy (NILM)   DIAGPAP - Benign reactive/reparative changes 07/04/2019 1206   HPVHIGH Negative 07/04/2019 1206   ADEQPAP  08/31/2022 1204    Satisfactory for evaluation; transformation zone component PRESENT.   ADEQPAP  07/04/2019 1206    Satisfactory for evaluation; transformation zone component PRESENT.    GYN HISTORY:    Component Value Date/Time   DIAGPAP  08/31/2022 1204    - Negative for intraepithelial lesion or malignancy (NILM)   DIAGPAP  07/04/2019 1206    - Negative for Intraepithelial Lesions or Malignancy (NILM)   DIAGPAP - Benign reactive/reparative changes 07/04/2019 1206   HPVHIGH Negative 07/04/2019 1206   ADEQPAP   08/31/2022 1204    Satisfactory for evaluation; transformation zone component PRESENT.   ADEQPAP  07/04/2019 1206    Satisfactory for evaluation; transformation zone component PRESENT.    OB History  Gravida Para Term Preterm AB Living  0 0 0 0 0 0  SAB IAB Ectopic Multiple Live Births  0 0 0 0 0  Obstetric Comments  1 adopted child    Past Medical History:  Diagnosis Date   Abnormal Pap smear of cervix    Blood clot in vein    leg   Endometriosis    Infertility, female    Ovarian cyst     Past Surgical History:  Procedure Laterality Date   ABLATION     COLPOSCOPY     RADIOFREQUENCY ABLATION, VEIN  2024    Current Outpatient Medications on File Prior to Visit  Medication Sig Dispense Refill   norethindrone  (MICRONOR ) 0.35 MG tablet TAKE 1 TABLET BY MOUTH EVERY DAY 84 tablet 0   No current facility-administered medications on file prior to visit.    Social History   Socioeconomic History   Marital status: Married    Spouse name: Not on file   Number of children: Not on file   Years of education: Not on file   Highest education level: Not on file  Occupational History   Not on file  Tobacco Use   Smoking status:  Former    Passive exposure: Never   Smokeless tobacco: Never   Tobacco comments:    college  Vaping Use   Vaping status: Never Used  Substance and Sexual Activity   Alcohol use: Yes    Comment: occ   Drug use: Never   Sexual activity: Yes    Partners: Male    Birth control/protection: OCP    Comment: married- 15 yrs   Other Topics Concern   Not on file  Social History Narrative   Not on file   Social Drivers of Health   Financial Resource Strain: Not on file  Food Insecurity: Not on file  Transportation Needs: Not on file  Physical Activity: Not on file  Stress: Not on file  Social Connections: Not on file  Intimate Partner Violence: Not on file    Family History  Problem Relation Age of Onset   Cancer Maternal Grandfather     Breast cancer Neg Hx      No Known Allergies    Patient's last menstrual period was No LMP recorded. (Menstrual status: Oral contraceptives)..            Review of Systems Alls systems reviewed and are negative.     Physical Exam Constitutional:      Appearance: Normal appearance.  Genitourinary:     Vulva and urethral meatus normal.     No lesions in the vagina.     Right Labia: No rash, lesions or skin changes.    Left Labia: No lesions, skin changes or rash.    No vaginal discharge or tenderness.     No vaginal prolapse present.    No vaginal atrophy present.     Right Adnexa: not tender, not palpable and no mass present.    Left Adnexa: not tender, not palpable and no mass present.    No cervical motion tenderness or discharge.     Uterus is not enlarged, tender or irregular.  Breasts:    Right: Normal.     Left: Normal.  HENT:     Head: Normocephalic.  Neck:     Thyroid : No thyroid  mass, thyromegaly or thyroid  tenderness.  Cardiovascular:     Rate and Rhythm: Normal rate and regular rhythm.     Heart sounds: Normal heart sounds, S1 normal and S2 normal.  Pulmonary:     Effort: Pulmonary effort is normal.     Breath sounds: Normal breath sounds and air entry.  Abdominal:     General: There is no distension.     Palpations: Abdomen is soft. There is no mass.     Tenderness: There is no abdominal tenderness. There is no guarding or rebound.  Musculoskeletal:        General: Normal range of motion.     Cervical back: Full passive range of motion without pain, normal range of motion and neck supple. No tenderness.     Right lower leg: No edema.     Left lower leg: No edema.  Neurological:     Mental Status: She is alert.  Skin:    General: Skin is warm.  Psychiatric:        Mood and Affect: Mood normal.        Behavior: Behavior normal.        Thought Content: Thought content normal.  Vitals and nursing note reviewed. Exam conducted with a chaperone  present.       A:         Well  Woman GYN exam                             P:        Pap smear not indicated Encouraged annual mammogram screening Colon cancer screening referral placed today DXA not indicated Labs and immunizations ordered today Discussed breast self exams Encouraged healthy lifestyle practices   No follow-ups on file.  Reinaldo Caras

## 2023-09-06 LAB — CBC
HCT: 41 % (ref 35.0–45.0)
Hemoglobin: 13.4 g/dL (ref 11.7–15.5)
MCH: 30.9 pg (ref 27.0–33.0)
MCHC: 32.7 g/dL (ref 32.0–36.0)
MCV: 94.7 fL (ref 80.0–100.0)
MPV: 11.3 fL (ref 7.5–12.5)
Platelets: 214 10*3/uL (ref 140–400)
RBC: 4.33 10*6/uL (ref 3.80–5.10)
RDW: 12.1 % (ref 11.0–15.0)
WBC: 5.3 10*3/uL (ref 3.8–10.8)

## 2023-09-06 LAB — LIPID PANEL
Cholesterol: 183 mg/dL (ref <200)
HDL: 57 mg/dL (ref >=50)
LDL Cholesterol (Calc): 113 mg/dL — ABNORMAL HIGH
Non-HDL Cholesterol (Calc): 126 mg/dL (ref <130)
Total CHOL/HDL Ratio: 3.2 (calc) (ref <5.0)
Triglycerides: 52 mg/dL (ref <150)

## 2023-09-06 LAB — COMPREHENSIVE METABOLIC PANEL WITH GFR
AG Ratio: 1.8 (calc) (ref 1.0–2.5)
ALT: 11 U/L (ref 6–29)
AST: 15 U/L (ref 10–30)
Albumin: 4.8 g/dL (ref 3.6–5.1)
Alkaline phosphatase (APISO): 44 U/L (ref 31–125)
BUN: 11 mg/dL (ref 7–25)
CO2: 26 mmol/L (ref 20–32)
Calcium: 9.4 mg/dL (ref 8.6–10.2)
Chloride: 103 mmol/L (ref 98–110)
Creat: 0.75 mg/dL (ref 0.50–0.99)
Globulin: 2.6 g/dL (ref 1.9–3.7)
Glucose, Bld: 81 mg/dL (ref 65–99)
Potassium: 4.3 mmol/L (ref 3.5–5.3)
Sodium: 136 mmol/L (ref 135–146)
Total Bilirubin: 0.7 mg/dL (ref 0.2–1.2)
Total Protein: 7.4 g/dL (ref 6.1–8.1)
eGFR: 101 mL/min/{1.73_m2} (ref >=60)

## 2023-09-06 LAB — VITAMIN D 25 HYDROXY (VIT D DEFICIENCY, FRACTURES): Vit D, 25-Hydroxy: 28 ng/mL — ABNORMAL LOW (ref 30–100)

## 2023-09-06 LAB — TSH: TSH: 3.27 m[IU]/L

## 2023-09-06 LAB — HEMOGLOBIN A1C
Hgb A1c MFr Bld: 5.4 % (ref <5.7)
Mean Plasma Glucose: 108 mg/dL
eAG (mmol/L): 6 mmol/L

## 2023-09-13 ENCOUNTER — Ambulatory Visit
Admission: RE | Admit: 2023-09-13 | Discharge: 2023-09-13 | Disposition: A | Discharge status: Home / Self Care | Source: Ambulatory Visit | Attending: Obstetrics and Gynecology | Admitting: Obstetrics and Gynecology

## 2023-09-13 DIAGNOSIS — Z0000 Encounter for general adult medical examination without abnormal findings: Secondary | ICD-10-CM

## 2023-09-13 DIAGNOSIS — Z1231 Encounter for screening mammogram for malignant neoplasm of breast: Secondary | ICD-10-CM | POA: Diagnosis not present

## 2023-09-17 ENCOUNTER — Ambulatory Visit: Payer: Self-pay | Admitting: Obstetrics and Gynecology

## 2023-10-15 ENCOUNTER — Encounter: Payer: Self-pay | Admitting: Obstetrics and Gynecology

## 2024-02-26 ENCOUNTER — Ambulatory Visit: Admitting: Physician Assistant

## 2024-02-26 ENCOUNTER — Encounter: Payer: Self-pay | Admitting: Physician Assistant

## 2024-02-26 VITALS — BP 112/81

## 2024-02-26 DIAGNOSIS — L578 Other skin changes due to chronic exposure to nonionizing radiation: Secondary | ICD-10-CM

## 2024-02-26 DIAGNOSIS — W908XXA Exposure to other nonionizing radiation, initial encounter: Secondary | ICD-10-CM

## 2024-02-26 DIAGNOSIS — D229 Melanocytic nevi, unspecified: Secondary | ICD-10-CM

## 2024-02-26 DIAGNOSIS — D1801 Hemangioma of skin and subcutaneous tissue: Secondary | ICD-10-CM | POA: Diagnosis not present

## 2024-02-26 DIAGNOSIS — L814 Other melanin hyperpigmentation: Secondary | ICD-10-CM

## 2024-02-26 DIAGNOSIS — Z1283 Encounter for screening for malignant neoplasm of skin: Secondary | ICD-10-CM

## 2024-02-26 DIAGNOSIS — L821 Other seborrheic keratosis: Secondary | ICD-10-CM | POA: Diagnosis not present

## 2024-02-26 NOTE — Progress Notes (Signed)
   New Patient Visit   Subjective  Alexandra Ballard is a 43 y.o. female NEW PATIENT who presents for the following: Skin Cancer Screening and Full Body Skin Exam - No history of skin cancer. No family history of melanoma.  The patient presents for Total-Body Skin Exam (TBSE) for skin cancer screening and mole check. The patient has spots, moles and lesions to be evaluated, some may be new or changing and the patient may have concern these could be cancer.    The following portions of the chart were reviewed this encounter and updated as appropriate: medications, allergies, medical history  Review of Systems:  No other skin or systemic complaints except as noted in HPI or Assessment and Plan.  Objective  Well appearing patient in no apparent distress; mood and affect are within normal limits.  A full examination was performed including scalp, head, eyes, ears, nose, lips, neck, chest, axillae, abdomen, back, buttocks, bilateral upper extremities, bilateral lower extremities, hands, feet, fingers, toes, fingernails, and toenails. All findings within normal limits unless otherwise noted below.   Relevant physical exam findings are noted in the Assessment and Plan.    Assessment & Plan   SKIN CANCER SCREENING PERFORMED TODAY.  ACTINIC DAMAGE - Chronic condition, secondary to cumulative UV/sun exposure - diffuse scaly erythematous macules with underlying dyspigmentation - Recommend daily broad spectrum sunscreen SPF 30+ to sun-exposed areas, reapply every 2 hours as needed.  - Staying in the shade or wearing long sleeves, sun glasses (UVA+UVB protection) and wide brim hats (4-inch brim around the entire circumference of the hat) are also recommended for sun protection.  - Call for new or changing lesions.  LENTIGINES, SEBORRHEIC KERATOSES, HEMANGIOMAS - Benign normal skin lesions - Benign-appearing - Call for any changes  MELANOCYTIC NEVI - Tan-brown and/or pink-flesh-colored  symmetric macules and papules - Benign appearing on exam today - Observation - Call clinic for new or changing moles - Recommend daily use of broad spectrum spf 30+ sunscreen to sun-exposed areas.        SCREENING EXAM FOR SKIN CANCER   LENTIGINES   CHERRY ANGIOMA   ACTINIC SKIN DAMAGE   MULTIPLE BENIGN NEVI   SEBORRHEIC KERATOSIS    Return in about 1 year (around 02/25/2025) for TBSE.  I, Roseline Hutchinson, CMA, am acting as scribe for Johnmichael Melhorn K, PA-C .   Documentation: I have reviewed the above documentation for accuracy and completeness, and I agree with the above.  Lavana Huckeba K, PA-C

## 2024-02-26 NOTE — Patient Instructions (Signed)

## 2024-05-06 ENCOUNTER — Encounter: Payer: Self-pay | Admitting: Pediatrics

## 2024-05-19 ENCOUNTER — Ambulatory Visit

## 2024-05-19 VITALS — Ht 65.75 in | Wt 140.0 lb

## 2024-05-19 DIAGNOSIS — Z1211 Encounter for screening for malignant neoplasm of colon: Secondary | ICD-10-CM

## 2024-05-19 MED ORDER — NA SULFATE-K SULFATE-MG SULF 17.5-3.13-1.6 GM/177ML PO SOLN: 1.0000 | Freq: Once | ORAL | 0 refills | Status: AC

## 2024-05-20 ENCOUNTER — Encounter: Payer: Self-pay | Admitting: Pediatrics

## 2024-06-02 ENCOUNTER — Encounter: Admitting: Pediatrics

## 2025-03-02 ENCOUNTER — Ambulatory Visit: Admitting: Physician Assistant
